# Patient Record
Sex: Female | Born: 1935 | Race: White | Hispanic: No | Smoking: Never smoker
Health system: Southern US, Community
[De-identification: ages and names within clinical notes are randomized; demographics above are authoritative.]

## PROBLEM LIST (undated history)

## (undated) DIAGNOSIS — K219 Gastro-esophageal reflux disease without esophagitis: Secondary | ICD-10-CM

## (undated) DIAGNOSIS — F32A Depression, unspecified: Secondary | ICD-10-CM

## (undated) DIAGNOSIS — E079 Disorder of thyroid, unspecified: Secondary | ICD-10-CM

## (undated) DIAGNOSIS — M1990 Unspecified osteoarthritis, unspecified site: Secondary | ICD-10-CM

## (undated) DIAGNOSIS — F329 Major depressive disorder, single episode, unspecified: Secondary | ICD-10-CM

## (undated) DIAGNOSIS — D049 Carcinoma in situ of skin, unspecified: Secondary | ICD-10-CM

## (undated) DIAGNOSIS — I10 Essential (primary) hypertension: Secondary | ICD-10-CM

## (undated) DIAGNOSIS — E785 Hyperlipidemia, unspecified: Secondary | ICD-10-CM

## (undated) DIAGNOSIS — K5790 Diverticulosis of intestine, part unspecified, without perforation or abscess without bleeding: Secondary | ICD-10-CM

## (undated) HISTORY — PX: HEMORRHOID SURGERY: SHX153

## (undated) HISTORY — PX: DILATION AND CURETTAGE OF UTERUS: SHX78

---

## 2004-04-10 ENCOUNTER — Other Ambulatory Visit: Payer: Self-pay

## 2005-10-31 ENCOUNTER — Ambulatory Visit: Payer: Self-pay | Admitting: Gastroenterology

## 2006-01-12 ENCOUNTER — Inpatient Hospital Stay: Payer: Self-pay | Admitting: Vascular Surgery

## 2006-03-20 ENCOUNTER — Ambulatory Visit: Payer: Self-pay | Admitting: Gastroenterology

## 2011-01-18 ENCOUNTER — Ambulatory Visit: Payer: Self-pay | Admitting: Unknown Physician Specialty

## 2011-01-18 HISTORY — PX: COLONOSCOPY: SHX174

## 2011-01-22 LAB — PATHOLOGY REPORT

## 2011-02-22 ENCOUNTER — Ambulatory Visit: Payer: Self-pay | Admitting: Internal Medicine

## 2011-04-01 ENCOUNTER — Ambulatory Visit: Payer: Self-pay | Admitting: Internal Medicine

## 2014-03-01 ENCOUNTER — Ambulatory Visit: Payer: Self-pay | Admitting: Family Medicine

## 2014-05-18 ENCOUNTER — Ambulatory Visit: Payer: Self-pay | Admitting: Unknown Physician Specialty

## 2014-05-18 HISTORY — PX: ESOPHAGOGASTRODUODENOSCOPY: SHX1529

## 2014-05-20 LAB — PATHOLOGY REPORT

## 2015-10-25 DIAGNOSIS — J019 Acute sinusitis, unspecified: Secondary | ICD-10-CM | POA: Diagnosis not present

## 2015-10-25 DIAGNOSIS — B9689 Other specified bacterial agents as the cause of diseases classified elsewhere: Secondary | ICD-10-CM | POA: Diagnosis not present

## 2015-11-21 DIAGNOSIS — Z1231 Encounter for screening mammogram for malignant neoplasm of breast: Secondary | ICD-10-CM | POA: Diagnosis not present

## 2015-11-21 DIAGNOSIS — N6489 Other specified disorders of breast: Secondary | ICD-10-CM | POA: Diagnosis not present

## 2016-01-17 ENCOUNTER — Ambulatory Visit
Admission: RE | Admit: 2016-01-17 | Discharge: 2016-01-17 | Disposition: A | Discharge status: Home / Self Care | Payer: Medicare Other | Source: Ambulatory Visit | Attending: Family Medicine | Admitting: Family Medicine

## 2016-01-17 ENCOUNTER — Other Ambulatory Visit: Payer: Self-pay | Admitting: Family Medicine

## 2016-01-17 DIAGNOSIS — J309 Allergic rhinitis, unspecified: Secondary | ICD-10-CM | POA: Diagnosis not present

## 2016-01-17 DIAGNOSIS — R053 Chronic cough: Secondary | ICD-10-CM

## 2016-01-17 DIAGNOSIS — R05 Cough: Secondary | ICD-10-CM

## 2016-04-16 ENCOUNTER — Other Ambulatory Visit: Payer: Self-pay | Admitting: Nurse Practitioner

## 2016-04-16 DIAGNOSIS — K219 Gastro-esophageal reflux disease without esophagitis: Secondary | ICD-10-CM

## 2016-04-16 DIAGNOSIS — R1310 Dysphagia, unspecified: Secondary | ICD-10-CM

## 2016-04-19 ENCOUNTER — Other Ambulatory Visit: Payer: Medicare Other

## 2016-04-22 ENCOUNTER — Ambulatory Visit
Admission: RE | Admit: 2016-04-22 | Discharge: 2016-04-22 | Disposition: A | Discharge status: Home / Self Care | Payer: Medicare Other | Source: Ambulatory Visit | Attending: Nurse Practitioner | Admitting: Nurse Practitioner

## 2016-04-22 DIAGNOSIS — R1310 Dysphagia, unspecified: Secondary | ICD-10-CM | POA: Diagnosis not present

## 2016-04-22 DIAGNOSIS — K449 Diaphragmatic hernia without obstruction or gangrene: Secondary | ICD-10-CM | POA: Diagnosis not present

## 2016-04-22 DIAGNOSIS — K228 Other specified diseases of esophagus: Secondary | ICD-10-CM | POA: Insufficient documentation

## 2016-04-22 DIAGNOSIS — K219 Gastro-esophageal reflux disease without esophagitis: Secondary | ICD-10-CM | POA: Diagnosis not present

## 2016-07-04 DIAGNOSIS — Z23 Encounter for immunization: Secondary | ICD-10-CM | POA: Diagnosis not present

## 2016-10-16 DIAGNOSIS — E039 Hypothyroidism, unspecified: Secondary | ICD-10-CM | POA: Diagnosis not present

## 2016-10-16 DIAGNOSIS — F329 Major depressive disorder, single episode, unspecified: Secondary | ICD-10-CM | POA: Diagnosis not present

## 2016-10-16 DIAGNOSIS — I10 Essential (primary) hypertension: Secondary | ICD-10-CM | POA: Diagnosis not present

## 2016-10-16 DIAGNOSIS — K219 Gastro-esophageal reflux disease without esophagitis: Secondary | ICD-10-CM | POA: Diagnosis not present

## 2016-10-16 DIAGNOSIS — Z0000 Encounter for general adult medical examination without abnormal findings: Secondary | ICD-10-CM | POA: Diagnosis not present

## 2016-10-16 DIAGNOSIS — E079 Disorder of thyroid, unspecified: Secondary | ICD-10-CM | POA: Diagnosis not present

## 2016-10-16 DIAGNOSIS — Z23 Encounter for immunization: Secondary | ICD-10-CM | POA: Diagnosis not present

## 2016-10-16 DIAGNOSIS — E784 Other hyperlipidemia: Secondary | ICD-10-CM | POA: Diagnosis not present

## 2016-11-27 DIAGNOSIS — Z1231 Encounter for screening mammogram for malignant neoplasm of breast: Secondary | ICD-10-CM | POA: Diagnosis not present

## 2016-11-27 DIAGNOSIS — R928 Other abnormal and inconclusive findings on diagnostic imaging of breast: Secondary | ICD-10-CM | POA: Diagnosis not present

## 2016-11-27 DIAGNOSIS — R921 Mammographic calcification found on diagnostic imaging of breast: Secondary | ICD-10-CM | POA: Diagnosis not present

## 2017-01-28 IMAGING — RF DG ESOPHAGUS
1 series · 11 of 11 positions shown · non-contrast
Comparison: None in PACs

CLINICAL DATA: Difficulty swallowing for the past 3-4 months,
occasional vomiting due to this problem.

EXAM:
ESOPHOGRAM / BARIUM SWALLOW / BARIUM TABLET STUDY
TECHNIQUE: Combined double contrast and single contrast examination performed
using effervescent crystals, thick barium liquid, and thin barium
liquid. The patient was observed with fluoroscopy swallowing a 13 mm
barium sulphate tablet.
FLUOROSCOPY TIME:  Fluoroscopy Time:  1 minutes, 12 seconds
Number of Acquired Images:  11

[Series 1: fluoro_barium 2fps_bw · 0.17mm/px · 11 of 11 slices shown]
[im 1/11]
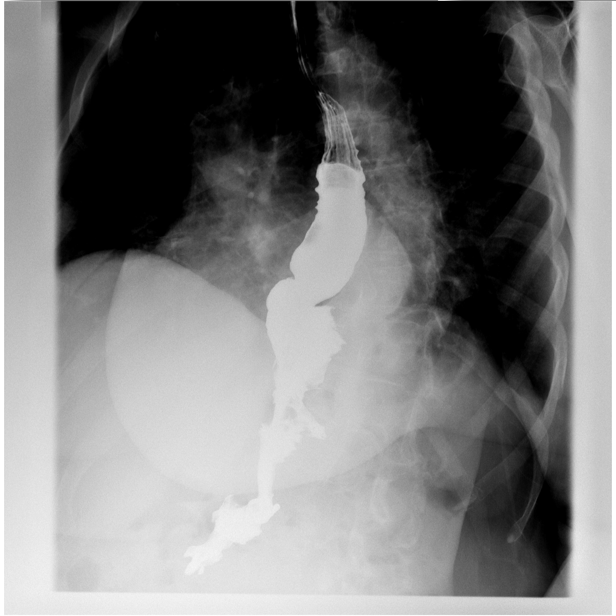
[im 2/11]
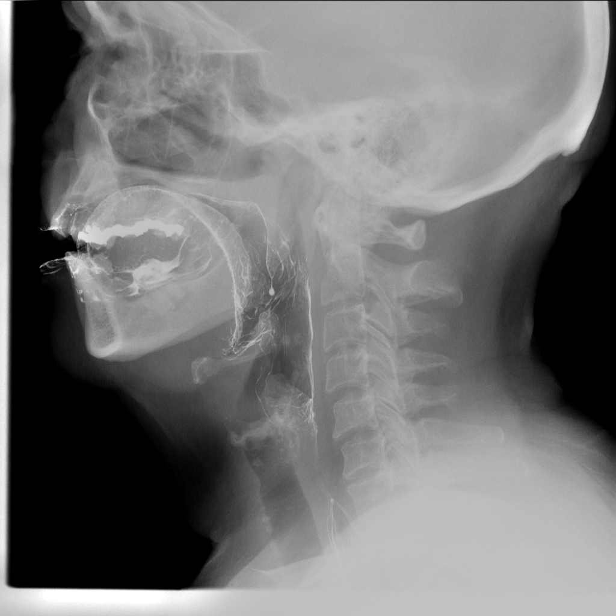
[im 3/11]
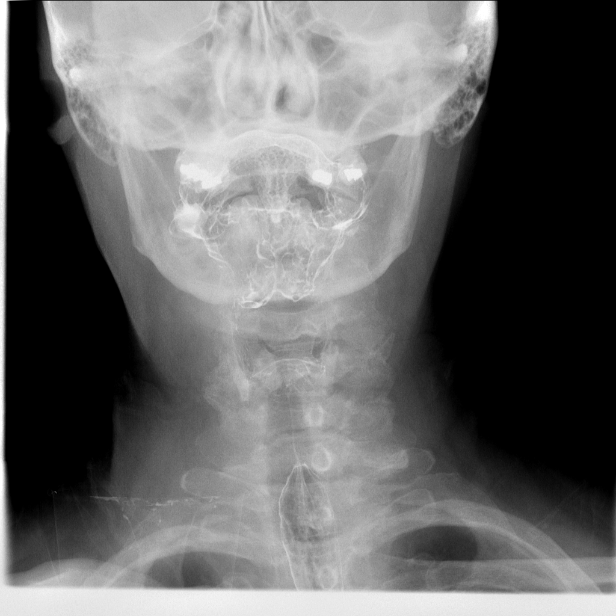
[im 4/11]
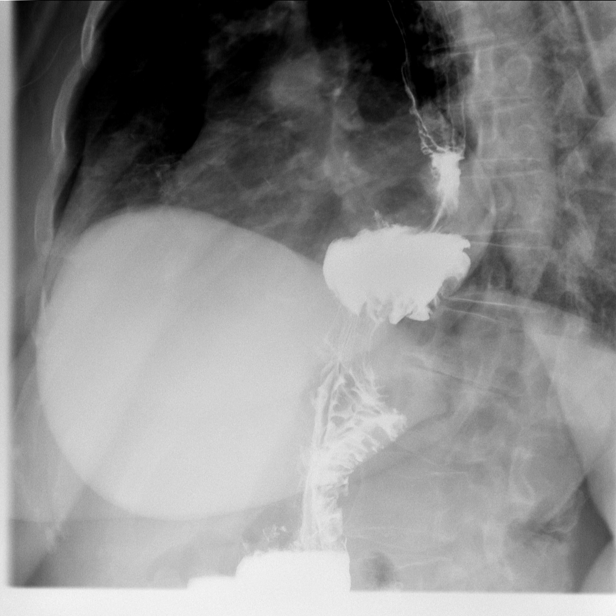
[im 5/11]
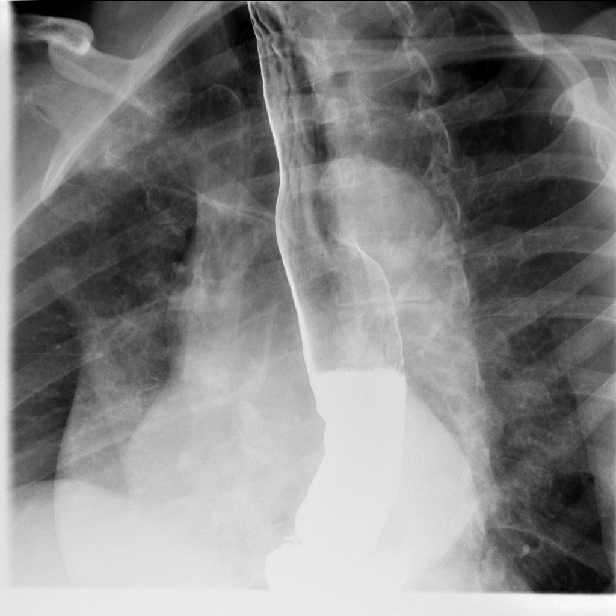
[im 6/11]
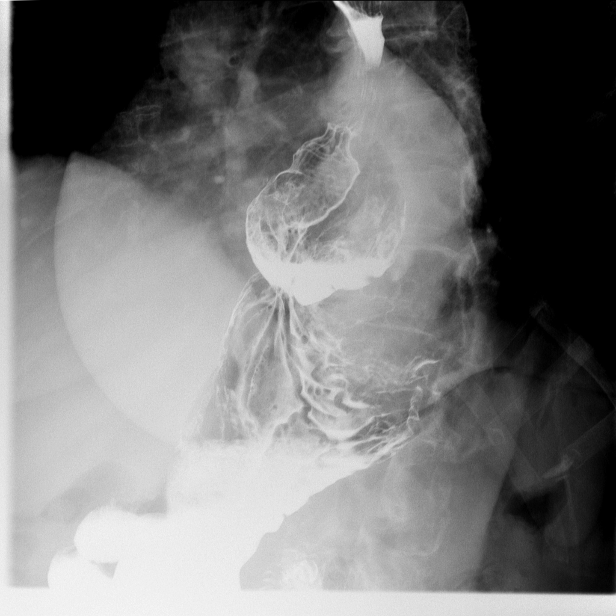
[im 7/11]
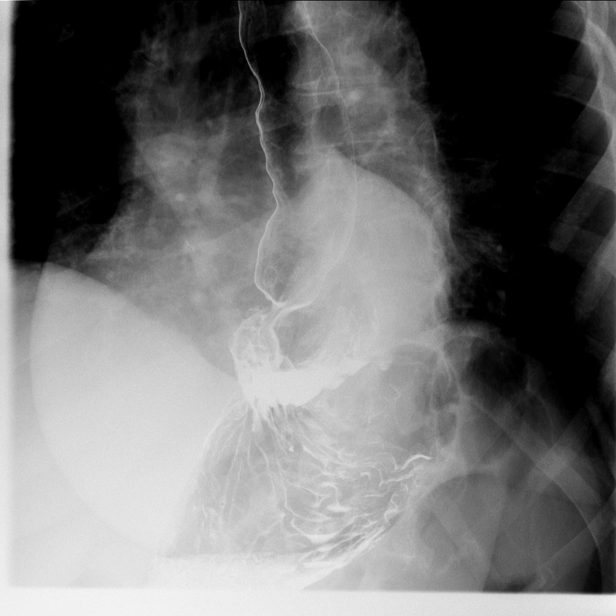
[im 8/11]
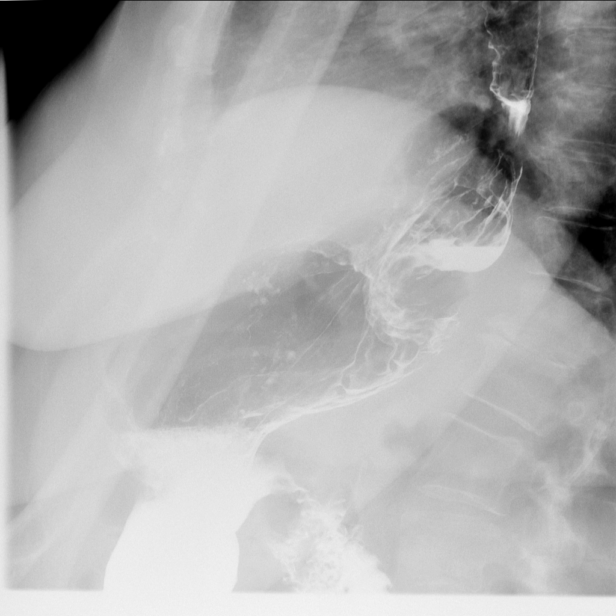
[im 9/11]
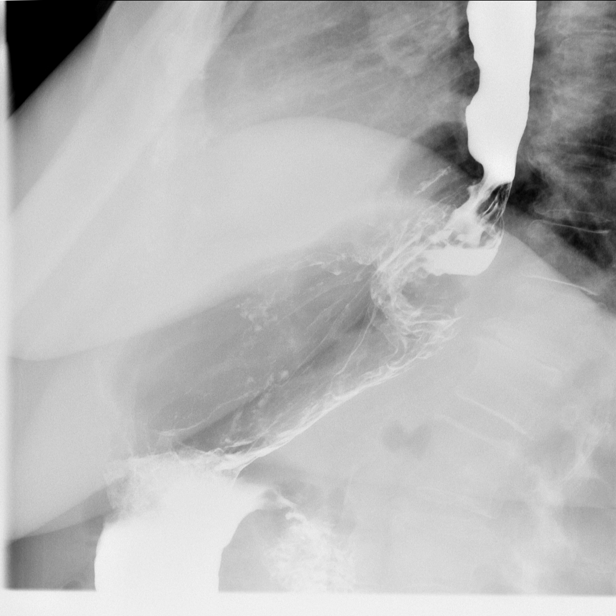
[im 10/11]
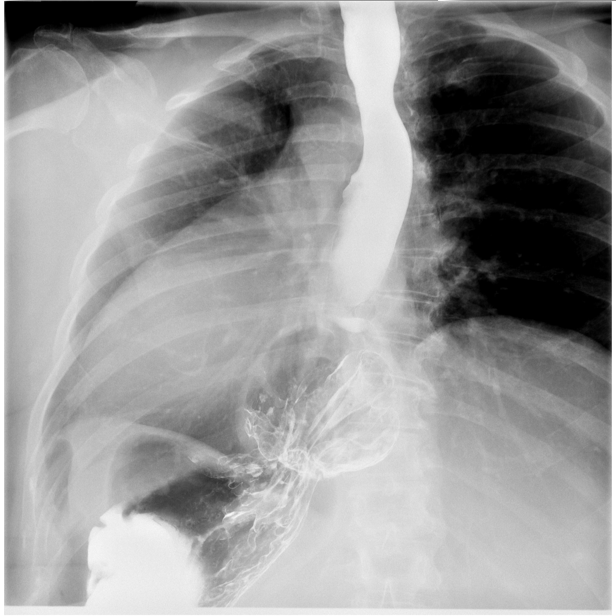
[im 11/11]
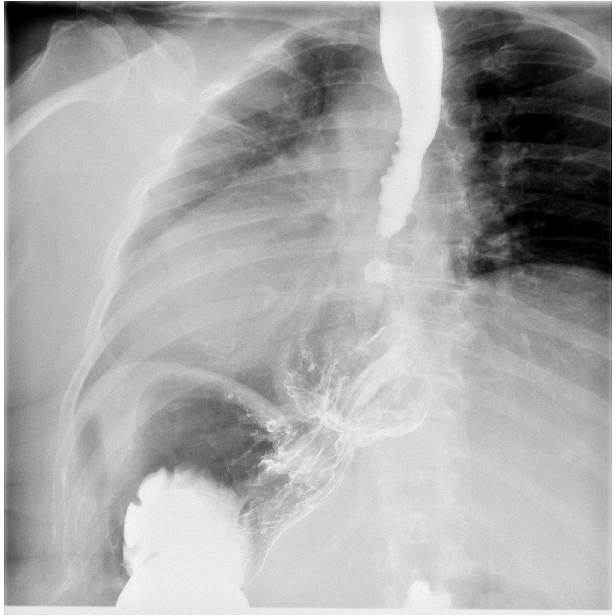

[11 of 11 positions shown; findings below may reference images not displayed]

FINDINGS: The patient ingested the thick and thin barium and gas forming
crystals without difficulty. The cervical esophagus distended well.
There was no laryngeal penetration of the barium. The thoracic
esophagus distended well down to its distal portion where there were
prominent tertiary contractions. A large non reducible hiatal hernia
was observed. There was no evidence of ulceration, stricture, or
mass. The barium tablet passed promptly from the mouth into the
stomach. The channel through the aortic hiatus is broad in the was
no difficulty emptying the hiatal hernia into the body of the
stomach. A small amount of gastroesophageal reflux was observed in
the prone position.
IMPRESSION: Moderate-sized hiatal hernia, incompletely reducible. There is no
evidence of stricture nor esophagitis nor significant reflux. When
distended with gas, the hiatal hernia produces mild mass effect upon
the distal esophagus just above the junction with the hiatal hernia.

Mild changes of presbyesophagus.

## 2017-02-11 ENCOUNTER — Ambulatory Visit
Admission: EM | Admit: 2017-02-11 | Discharge: 2017-02-11 | Disposition: A | Discharge status: Home / Self Care | Payer: Medicare Other | Attending: Family Medicine | Admitting: Family Medicine

## 2017-02-11 DIAGNOSIS — W57XXXA Bitten or stung by nonvenomous insect and other nonvenomous arthropods, initial encounter: Secondary | ICD-10-CM | POA: Insufficient documentation

## 2017-02-11 DIAGNOSIS — S30861A Insect bite (nonvenomous) of abdominal wall, initial encounter: Secondary | ICD-10-CM | POA: Insufficient documentation

## 2017-02-11 HISTORY — DX: Essential (primary) hypertension: I10

## 2017-02-11 HISTORY — DX: Diverticulosis of intestine, part unspecified, without perforation or abscess without bleeding: K57.90

## 2017-02-11 HISTORY — DX: Unspecified osteoarthritis, unspecified site: M19.90

## 2017-02-11 HISTORY — DX: Gastro-esophageal reflux disease without esophagitis: K21.9

## 2017-02-11 HISTORY — DX: Major depressive disorder, single episode, unspecified: F32.9

## 2017-02-11 HISTORY — DX: Carcinoma in situ of skin, unspecified: D04.9

## 2017-02-11 HISTORY — DX: Hyperlipidemia, unspecified: E78.5

## 2017-02-11 HISTORY — DX: Depression, unspecified: F32.A

## 2017-02-11 HISTORY — DX: Disorder of thyroid, unspecified: E07.9

## 2017-02-11 MED ORDER — MUPIROCIN 2 % EX OINT: TOPICAL_OINTMENT | CUTANEOUS | 0 refills | Status: DC

## 2017-02-11 MED ORDER — DOXYCYCLINE HYCLATE 100 MG PO TABS
200.0000 mg | ORAL_TABLET | Freq: Once | ORAL | Status: AC
  Administered 2017-02-11: 200 mg via ORAL

## 2017-02-11 NOTE — ED Provider Notes (Signed)
[facility] ____________________________________________  Time seen: Approximately 5:12 PM  I have reviewed the triage vital signs and the nursing notes.   HISTORY  Chief Complaint Tick Removal   HPI Wanda Phillips is a 82 y.o. female presenting for evaluation of a tick bite site. Patient states she pulled a tick off her left side 40 minutes prior to arrival. Patient reports that this was a deer tick and was on for approximately 2 days. Patient reports that she is feeling well, but was worried about the tick bite site. States that the site does itch some but denies any rash. Denies any other skin changes. Denies any other insect bites or tick bites or tick attachments. Patient expressed concern of Lyme disease that she states that she has had a bull's-eye rash many years ago from a tick bite as well as her daughter has had Lyme disease. Patient reports that the tick was removed just prior to arrival and appeared to have the head intact. Denies any pain. Reports feels well. Denies fevers, headaches, joint pain, malaise, chest pain, shortness of breath. States that she did have some diarrhea yesterday that was short lived and fully resolved. Denies any other complaints or recent changes.  Denies recent sickness. Denies recent antibiotic use. Denies cardiac history. Denies renal insufficiency.Reports tetanus immunization up to date.   WHITE, Arlyss Repress, NP: PCP   Past Medical History:  Diagnosis Date  . Arthritis   . Basal cell carcinoma in situ of skin   . Depression   . Diverticulosis   . GERD (gastroesophageal reflux disease)   . Hyperlipidemia   . Hypertension   . Thyroid disease     There are no active problems to display for this patient.   Past Surgical History:  Procedure Laterality Date  . COLONOSCOPY  01/18/2011   Diverticulosis  . DILATION AND CURETTAGE OF UTERUS    . ESOPHAGOGASTRODUODENOSCOPY  05/18/2014   No Repeat  . HEMORRHOID SURGERY        No current facility-administered medications for this encounter.   Current Outpatient Prescriptions:  .  aspirin EC 81 MG tablet, Take 81 mg by mouth daily., Disp: , Rfl:  .  levothyroxine (SYNTHROID, LEVOTHROID) 50 MCG tablet, Take 50 mcg by mouth daily before breakfast., Disp: , Rfl:  .  losartan-hydrochlorothiazide (HYZAAR) 100-25 MG tablet, Take 1 tablet by mouth daily., Disp: , Rfl:  .  Multiple Vitamin (MULTIVITAMIN) tablet, Take 1 tablet by mouth daily., Disp: , Rfl:  .  omeprazole (PRILOSEC) 20 MG capsule, Take 20 mg by mouth daily., Disp: , Rfl:  .  sertraline (ZOLOFT) 50 MG tablet, Take 50 mg by mouth daily., Disp: , Rfl:  .  mupirocin ointment (BACTROBAN) 2 %, Apply three times a day for 7 days., Disp: 22 g, Rfl: 0  Allergies Sulfa antibiotics  Family History  Problem Relation Age of Onset  . Diabetes Mother   . Heart disease Father   . CAD Sister   . CVA Sister   . Arthritis Brother   . Heart attack Brother   . Prostate cancer Brother   . CAD Brother     Social History Social History  Substance Use Topics  . Smoking status: Never Smoker  . Smokeless tobacco: Never Used  . Alcohol use No    Review of Systems Constitutional: No fever/chills ENT: No sore throat. Cardiovascular: Denies chest pain. Respiratory: Denies shortness of breath. Gastrointestinal: No abdominal pain.  No nausea, no vomiting.  No diarrhea.  No constipation. Genitourinary: Negative for dysuria. Musculoskeletal: Negative for back pain. Skin: As above. Neurological: Negative for headaches, focal weakness or numbness.   ____________________________________________   PHYSICAL EXAM:  VITAL SIGNS: ED Triage Vitals  Enc Vitals Group     BP 02/11/17 1624 (!) 142/58     Pulse Rate 02/11/17 1624 79     Resp 02/11/17 1624 18     Temp 02/11/17 1624 98.5 F (36.9 C)     Temp Source 02/11/17 1624 Oral     SpO2 02/11/17 1624 98 %     Weight 02/11/17 1620 170 lb (77.1 kg)     Height  02/11/17 1620  (1.575 m)     Head Circumference --      Peak Flow --      Pain Score --      Pain Loc --      Pain Edu? --      Excl. in GC? --     Constitutional: Alert and oriented. Well appearing and in no acute distress. ENT      Head: Normocephalic and atraumatic. Cardiovascular: Normal rate, regular rhythm. Grossly normal heart sounds.  Good peripheral circulation. Respiratory: Normal respiratory effort without tachypnea nor retractions. Breath sounds are clear and equal bilaterally. No wheezes, rales, rhonchi. Gastrointestinal: Soft and nontender. No distention. No CVA tenderness. Musculoskeletal:  Steady gait. No midline cervical, thoracic or lumbar tenderness to palpation.  Neurologic:  Normal speech and language. Speech is normal. No gait instability.  Skin:  Skin is warm, dry and intact. No rash noted. Except: Left lower flank less than 1 cm area of minimal erythema with centered punctum, no foreign body visible, no surrounding erythema, no fluctuance, no induration, nontender. No other insect foreign bodies noted. Psychiatric: Mood and affect are normal. Speech and behavior are normal. Patient exhibits appropriate insight and judgment  ___________________________________________   LABS (all labs ordered are listed, but only abnormal results are displayed)  Labs Reviewed  ROCKY MTN SPOTTED FVR ABS PNL(IGG+IGM)  B. BURGDORFI ANTIBODIES    PROCEDURES Procedures    INITIAL IMPRESSION / ASSESSMENT AND PLAN / ED COURSE  Pertinent labs & imaging results that were available during my care of the patient were reviewed by me and considered in my medical decision making (see chart for details).  Well appearing patient. No acute distress. Presenting for evaluation of tick bite site post tick removal. Patient denies any other complaints and reports feels well. Denies complaints or presenting symptoms regarding systemic disease. No appearance of cellulitis. Appears to have  localized reaction from tick bite. Wound inspected and no retained product or foreign body noted. Discussed with patient monitoring closely and keeping clean, avoiding scratching. Patient requests to have [facility] spotted fever and Lyme disease labs drawn, labs drawn. Will administer 200 mg total dose of doxycycline in urgent care for prophylactic dose. Will treat with topical Bactroban. Discussed indication, risks and benefits of medications with patient.  Discussed follow up with Primary care physician this week. Discussed follow up and return parameters including no resolution or any worsening concerns. Patient verbalized understanding and agreed to plan.   ____________________________________________   FINAL CLINICAL IMPRESSION(S) / ED DIAGNOSES  Final diagnoses:  Tick bite of flank, initial encounter     New Prescriptions   MUPIROCIN OINTMENT (BACTROBAN) 2 %    Apply three times a day for 7 days.    Note: This dictation was prepared with Dragon dictation along with smaller phrase technology. Any transcriptional errors  that result from this process are unintentional.         Renford Dills, NP 02/13/17 0910    Renford Dills, NP 02/13/17 [phone]

## 2017-02-11 NOTE — ED Triage Notes (Signed)
Patient complains of tick bite to left flank. Patient states that tick has probably been on since Sunday evening. Patient states that she is very concerned since pulling it off. Patient reports that she pulled it off around 40 minutes ago.

## 2017-02-11 NOTE — Discharge Instructions (Signed)
Use medication as prescribed. Monitor. Keep clean.   Follow up with your primary care physician this week as needed. Return to Urgent care for new or worsening concerns.

## 2017-02-13 LAB — B. BURGDORFI ANTIBODIES

## 2017-02-14 LAB — ROCKY MTN SPOTTED FVR ABS PNL(IGG+IGM)
RMSF IGG: UNDETERMINED
RMSF IGM: 0.19 {index} (ref 0.00–0.89)

## 2017-02-14 LAB — RMSF, IGG, IFA: RMSF, IGG, IFA: 1:64 {titer} — ABNORMAL HIGH

## 2017-02-19 ENCOUNTER — Telehealth: Payer: Self-pay | Admitting: *Deleted

## 2017-02-19 NOTE — Telephone Encounter (Signed)
Patient received letter mailed to her requesting a call back. Informed patient that she did have a positive RMSF test result and the prescriptions given during her visit at [facility] are sufficient for treatment. Patient also informed that the Lyme disease test result was negative. Advised patient to follow up with PCP if symptoms develop. Patient confirmed understanding of information.

## 2017-04-22 DIAGNOSIS — Z79899 Other long term (current) drug therapy: Secondary | ICD-10-CM | POA: Diagnosis not present

## 2017-04-22 DIAGNOSIS — L400 Psoriasis vulgaris: Secondary | ICD-10-CM | POA: Diagnosis not present

## 2017-06-30 DIAGNOSIS — Z23 Encounter for immunization: Secondary | ICD-10-CM | POA: Diagnosis not present

## 2017-08-21 ENCOUNTER — Encounter: Payer: Self-pay | Admitting: Emergency Medicine

## 2017-08-21 ENCOUNTER — Ambulatory Visit
Admission: EM | Admit: 2017-08-21 | Discharge: 2017-08-21 | Disposition: A | Discharge status: Home / Self Care | Payer: Medicare Other | Attending: Family Medicine | Admitting: Family Medicine

## 2017-08-21 DIAGNOSIS — J069 Acute upper respiratory infection, unspecified: Secondary | ICD-10-CM

## 2017-08-21 MED ORDER — BENZONATATE 200 MG PO CAPS: ORAL_CAPSULE | ORAL | 0 refills | Status: DC

## 2017-08-21 MED ORDER — HYDROCOD POLST-CPM POLST ER 10-8 MG/5ML PO SUER: 2.5000 mL | Freq: Two times a day (BID) | ORAL | 0 refills | Status: DC

## 2017-08-21 NOTE — ED Provider Notes (Signed)
[facility]    CSN: [id number] Arrival date & time: 08/21/17  1020     History   Chief Complaint Chief Complaint  Patient presents with  . Cough  . Nasal Congestion    HPI Wanda Phillips is a 81 y.o. female.   HPI  This a 81 year old female who presents with a nonproductive cough sore throat and sinus congestion started approximately 5 days ago. She states on Tuesday she felt better but on Wednesday returned touch worse. Works at [employer] and was out of works Saturday Sunday and Monday and has not worked since Wednesday now. She has had no fever but has had some chills. She been taking NyQuil and Robitussin but without relief.         Past Medical History:  Diagnosis Date  . Arthritis   . Basal cell carcinoma in situ of skin   . Depression   . Diverticulosis   . GERD (gastroesophageal reflux disease)   . Hyperlipidemia   . Hypertension   . Thyroid disease     There are no active problems to display for this patient.   Past Surgical History:  Procedure Laterality Date  . COLONOSCOPY  01/18/2011   Diverticulosis  . DILATION AND CURETTAGE OF UTERUS    . ESOPHAGOGASTRODUODENOSCOPY  05/18/2014   No Repeat  . HEMORRHOID SURGERY      OB History    No data available       Home Medications    Prior to Admission medications   Medication Sig Start Date End Date Taking? Authorizing Provider  aspirin EC 81 MG tablet Take 81 mg by mouth daily.   Yes [provider]  levothyroxine (SYNTHROID, LEVOTHROID) 50 MCG tablet Take 50 mcg by mouth daily before breakfast.   Yes [provider]  losartan-hydrochlorothiazide (HYZAAR) 100-25 MG tablet Take 1 tablet by mouth daily.   Yes [provider]  Multiple Vitamin (MULTIVITAMIN) tablet Take 1 tablet by mouth daily.   Yes [provider]  mupirocin ointment (BACTROBAN) 2 % Apply three times a day for 7 days. 02/11/17  Yes Renford DillsMiller, Lindsey, NP  omeprazole (PRILOSEC) 20 MG  capsule Take 20 mg by mouth daily.   Yes [provider]  sertraline (ZOLOFT) 50 MG tablet Take 50 mg by mouth daily.   Yes [provider]  benzonatate (TESSALON) 200 MG capsule Take one cap TID PRN cough 08/21/17   Lutricia Feiloemer, Lashonta Pilling P, PA-C  chlorpheniramine-HYDROcodone [facility](TUSSIONEX PENNKINETIC ER) 10-8 MG/5ML SUER Take 2.5 mLs by mouth 2 (two) times daily. As necessary for cough 08/21/17   Lutricia Feiloemer, Lanaya Bennis P, PA-C    Family History Family History  Problem Relation Age of Onset  . Diabetes Mother   . Heart disease Father   . CAD Sister   . CVA Sister   . Arthritis Brother   . Heart attack Brother   . Prostate cancer Brother   . CAD Brother     Social History Social History  Substance Use Topics  . Smoking status: Never Smoker  . Smokeless tobacco: Never Used  . Alcohol use No     Allergies   Sulfa antibiotics   Review of Systems Review of Systems  Constitutional: Positive for activity change and chills. Negative for diaphoresis, fatigue and fever.  HENT: Positive for congestion, postnasal drip, rhinorrhea, sinus pressure and sore throat.   Respiratory: Positive for cough. Negative for shortness of breath, wheezing and stridor.   All other systems reviewed and are negative.  Physical Exam Triage Vital Signs ED Triage Vitals  Enc Vitals Group     BP 08/21/17 1032 139/62     Pulse Rate 08/21/17 1032 85     Resp 08/21/17 1032 16     Temp 08/21/17 1032 98.9 F (37.2 C)     Temp Source 08/21/17 1032 Oral     SpO2 08/21/17 1032 98 %     Weight 08/21/17 1031 160 lb (72.6 kg)     Height 08/21/17 1031 5\' 2"  (1.575 m)     Head Circumference --      Peak Flow --      Pain Score 08/21/17 1032 0     Pain Loc --      Pain Edu? --      Excl. in GC? --    No data found.   Updated Vital Signs BP 139/62 (BP Location: Left Arm)   Pulse 85   Temp 98.9 F (37.2 C) (Oral)   Resp 16   Ht 5\' 2"  (1.575 m)   Wt 160 lb (72.6 kg)   SpO2 98%   BMI 29.26 kg/m    Visual Acuity Right Eye Distance:   Left Eye Distance:   Bilateral Distance:    Right Eye Near:   Left Eye Near:    Bilateral Near:     Physical Exam  Constitutional: She is oriented to person, place, and time. She appears well-developed and well-nourished. No distress.  HENT:  Head: Normocephalic.  Right Ear: External ear normal.  Left Ear: External ear normal.  Nose: Nose normal.  Mouth/Throat: Oropharynx is clear and moist. No oropharyngeal exudate.  Eyes: Pupils are equal, round, and reactive to light. Right eye exhibits no discharge. Left eye exhibits no discharge.  Neck: Normal range of motion.  Pulmonary/Chest: Effort normal and breath sounds normal.  Musculoskeletal: Normal range of motion.  Lymphadenopathy:    She has no cervical adenopathy.  Neurological: She is alert and oriented to person, place, and time.  Skin: Skin is warm and dry. She is not diaphoretic.  Psychiatric: She has a normal mood and affect. Her behavior is normal. Judgment and thought content normal.  Nursing note and vitals reviewed.    UC Treatments / Results  Labs (all labs ordered are listed, but only abnormal results are displayed) Labs Reviewed - No data to display  EKG  EKG Interpretation None       Radiology No results found.  Procedures Procedures (including critical care time)  Medications Ordered in UC Medications - No data to display   Initial Impression / Assessment and Plan / UC Course  I have reviewed the triage vital signs and the nursing notes.  Pertinent labs & imaging results that were available during my care of the patient were reviewed by me and considered in my medical decision making (see chart for details).     Plan: 1. Test/x-ray results and diagnosis reviewed with patient 2. rx as per orders; risks, benefits, potential side effects reviewed with patient 3. Recommend supportive treatment with rest and fluids. Use Tussionex at nighttime coughing but  she is cautioned regarding the current concentration are judgment not driving while taking the medication. Will be in very cough for careful when she gets up out of bed avoid stumbling or falling. To use the Tessalon Perles during the daytime. If she is not improving she should follow-up with her primary care physician or she may return to our clinic. 4. F/u prn if symptoms worsen or  don't improve   Final Clinical Impressions(s) / UC Diagnoses   Final diagnoses:  Acute upper respiratory infection    New Prescriptions Discharge Medication List as of 08/21/2017 11:03 AM    START taking these medications   Details  benzonatate (TESSALON) 200 MG capsule Take one cap TID PRN cough, Normal    chlorpheniramine-HYDROcodone (TUSSIONEX PENNKINETIC ER) 10-8 MG/5ML SUER Take 2.5 mLs by mouth 2 (two) times daily. As necessary for cough, Starting Thu 08/21/2017, Print         Controlled Substance Prescriptions [place] Controlled Substance Registry consulted? Not Applicable   Lutricia Feil, PA-C 08/21/17 1113

## 2017-08-21 NOTE — ED Triage Notes (Signed)
Patient in today c/o non-productive cough and nasal congestion x 5 days. Patient thinks she has had a low grade fever, but hasn't taken her temperature. Patient denies chills. Patient has tried Nyquil and Robitussin Cough without relief.

## 2017-08-28 ENCOUNTER — Ambulatory Visit
Admission: EM | Admit: 2017-08-28 | Discharge: 2017-08-28 | Disposition: A | Discharge status: Home / Self Care | Payer: Medicare Other | Attending: Family Medicine | Admitting: Family Medicine

## 2017-08-28 ENCOUNTER — Other Ambulatory Visit: Payer: Self-pay

## 2017-08-28 ENCOUNTER — Encounter: Payer: Self-pay | Admitting: *Deleted

## 2017-08-28 DIAGNOSIS — R05 Cough: Secondary | ICD-10-CM | POA: Diagnosis not present

## 2017-08-28 DIAGNOSIS — B349 Viral infection, unspecified: Secondary | ICD-10-CM | POA: Diagnosis not present

## 2017-08-28 DIAGNOSIS — J988 Other specified respiratory disorders: Secondary | ICD-10-CM | POA: Diagnosis not present

## 2017-08-28 MED ORDER — DOXYCYCLINE HYCLATE 100 MG PO CAPS: 100.0000 mg | ORAL_CAPSULE | Freq: Two times a day (BID) | ORAL | 0 refills | Status: DC

## 2017-08-28 NOTE — ED Triage Notes (Signed)
Patient symptoms of cough and nasal congestion have not resolved since visit on 08/21/17.

## 2017-08-28 NOTE — Discharge Instructions (Signed)
Antibiotic as prescribed.  Take care  Dr. Anjani Feuerborn  

## 2017-08-28 NOTE — ED Provider Notes (Signed)
[facility]    CSN: [id number] Arrival date & time: 08/28/17  1704  History   Chief Complaint Chief Complaint  Patient presents with  . Cough  . Nasal Congestion   HPI  81 year old female presents with the above complaints.  Patient was recently seen on 11/1.  Prior to that she had been sick for 5 days.  She has now been sick for 12 days and has had no improvement.  She has taken her medications as prescribed without significant improvement.  She continues to have severe cough, congestion, rhinorrhea and associated fatigue.  No reported fevers or chills.  No known exacerbating relieving factors.  No other associated symptoms.  No other complaints at this time.  Past Medical History:  Diagnosis Date  . Arthritis   . Basal cell carcinoma in situ of skin   . Depression   . Diverticulosis   . GERD (gastroesophageal reflux disease)   . Hyperlipidemia   . Hypertension   . Thyroid disease    Past Surgical History:  Procedure Laterality Date  . COLONOSCOPY  01/18/2011   Diverticulosis  . DILATION AND CURETTAGE OF UTERUS    . ESOPHAGOGASTRODUODENOSCOPY  05/18/2014   No Repeat  . HEMORRHOID SURGERY     OB History    No data available     Home Medications    Prior to Admission medications   Medication Sig Start Date End Date Taking? Authorizing Provider  aspirin EC 81 MG tablet Take 81 mg by mouth daily.    [provider]  benzonatate (TESSALON) 200 MG capsule Take one cap TID PRN cough 08/21/17   Lutricia Feiloemer, William P, PA-C  chlorpheniramine-HYDROcodone [facility](TUSSIONEX PENNKINETIC ER) 10-8 MG/5ML SUER Take 2.5 mLs by mouth 2 (two) times daily. As necessary for cough 08/21/17   Ovid Curdoemer, William P, PA-C  doxycycline (VIBRAMYCIN) 100 MG capsule Take 1 capsule (100 mg total) 2 (two) times daily by mouth. 08/28/17   Tommie Samsook, Erienne Spelman G, DO  levothyroxine (SYNTHROID, LEVOTHROID) 50 MCG tablet Take 50 mcg by mouth daily before breakfast.    [provider]    losartan-hydrochlorothiazide (HYZAAR) 100-25 MG tablet Take 1 tablet by mouth daily.    [provider]  Multiple Vitamin (MULTIVITAMIN) tablet Take 1 tablet by mouth daily.    [provider]  mupirocin ointment (BACTROBAN) 2 % Apply three times a day for 7 days. 02/11/17   Renford DillsMiller, Lindsey, NP  omeprazole (PRILOSEC) 20 MG capsule Take 20 mg by mouth daily.    [provider]  sertraline (ZOLOFT) 50 MG tablet Take 50 mg by mouth daily.    [provider]   Family History Family History  Problem Relation Age of Onset  . Diabetes Mother   . Heart disease Father   . CAD Sister   . CVA Sister   . Arthritis Brother   . Heart attack Brother   . Prostate cancer Brother   . CAD Brother    Social History Social History   Tobacco Use  . Smoking status: Never Smoker  . Smokeless tobacco: Never Used  Substance Use Topics  . Alcohol use: No  . Drug use: No    Allergies   Sulfa antibiotics   Review of Systems Review of Systems  Constitutional: Positive for fatigue. Negative for chills and fever.  HENT: Positive for congestion and rhinorrhea.   Respiratory: Positive for cough.    Physical Exam Triage Vital Signs ED Triage Vitals  Enc Vitals Group  BP 08/28/17 1713 (!) 151/76     Pulse Rate 08/28/17 1713 80     Resp 08/28/17 1713 16     Temp 08/28/17 1713 97.9 F (36.6 C)     Temp src --      SpO2 08/28/17 1713 98 %     Weight --      Height --      Head Circumference --      Peak Flow --      Pain Score 08/28/17 1714 0     Pain Loc --      Pain Edu? --      Excl. in GC? --    Updated Vital Signs BP (!) 151/76 (BP Location: Left Arm)   Pulse 80   Temp 97.9 F (36.6 C)   Resp 16   SpO2 98%   Physical Exam  Constitutional: She is oriented to person, place, and time. She appears well-developed. No distress.  HENT:  Head: Normocephalic and atraumatic.  Mouth/Throat: Oropharynx is clear and moist.  Normal TM's bilaterally.   Eyes: Conjunctivae are normal. Right eye exhibits no discharge. Left eye exhibits no discharge.  Neck: Neck supple.  Cardiovascular: Normal rate and regular rhythm.  No murmur heard. Pulmonary/Chest: Effort normal and breath sounds normal. She has no wheezes. She has no rales.  Lymphadenopathy:    She has no cervical adenopathy.  Neurological: She is alert and oriented to person, place, and time.  Psychiatric: She has a normal mood and affect.  Vitals reviewed.  UC Treatments / Results  Labs (all labs ordered are listed, but only abnormal results are displayed) Labs Reviewed - No data to display  EKG  EKG Interpretation None       Radiology No results found.  Procedures Procedures (including critical care time)  Medications Ordered in UC Medications - No data to display   Initial Impression / Assessment and Plan / UC Course  I have reviewed the triage vital signs and the nursing notes.  Pertinent labs & imaging results that were available during my care of the patient were reviewed by me and considered in my medical decision making (see chart for details).     81 year old female presents with a respiratory infection. Been persistent for nearly 2 weeks. Treating with Doxy.  Final Clinical Impressions(s) / UC Diagnoses   Final diagnoses:  Respiratory infection    ED Discharge Orders        Ordered    doxycycline (VIBRAMYCIN) 100 MG capsule  2 times daily     08/28/17 1720     Controlled Substance Prescriptions [place] Controlled Substance Registry consulted? Not Applicable   Tommie SamsCook, Sharonlee Nine G, DO 08/28/17 1729

## 2017-09-22 DIAGNOSIS — Z79899 Other long term (current) drug therapy: Secondary | ICD-10-CM | POA: Diagnosis not present

## 2017-09-22 DIAGNOSIS — L400 Psoriasis vulgaris: Secondary | ICD-10-CM | POA: Diagnosis not present

## 2017-10-17 DIAGNOSIS — K219 Gastro-esophageal reflux disease without esophagitis: Secondary | ICD-10-CM | POA: Diagnosis not present

## 2017-10-17 DIAGNOSIS — E039 Hypothyroidism, unspecified: Secondary | ICD-10-CM | POA: Diagnosis not present

## 2017-10-17 DIAGNOSIS — Z1231 Encounter for screening mammogram for malignant neoplasm of breast: Secondary | ICD-10-CM | POA: Diagnosis not present

## 2017-10-17 DIAGNOSIS — E2839 Other primary ovarian failure: Secondary | ICD-10-CM | POA: Diagnosis not present

## 2017-10-17 DIAGNOSIS — E871 Hypo-osmolality and hyponatremia: Secondary | ICD-10-CM | POA: Diagnosis not present

## 2017-10-17 DIAGNOSIS — Z0000 Encounter for general adult medical examination without abnormal findings: Secondary | ICD-10-CM | POA: Diagnosis not present

## 2017-10-17 DIAGNOSIS — I10 Essential (primary) hypertension: Secondary | ICD-10-CM | POA: Diagnosis not present

## 2017-10-17 DIAGNOSIS — F329 Major depressive disorder, single episode, unspecified: Secondary | ICD-10-CM | POA: Diagnosis not present

## 2017-10-29 DIAGNOSIS — E871 Hypo-osmolality and hyponatremia: Secondary | ICD-10-CM | POA: Diagnosis not present

## 2017-11-17 DIAGNOSIS — I10 Essential (primary) hypertension: Secondary | ICD-10-CM | POA: Diagnosis not present

## 2017-11-17 DIAGNOSIS — E079 Disorder of thyroid, unspecified: Secondary | ICD-10-CM | POA: Diagnosis not present

## 2017-12-01 DIAGNOSIS — Z1231 Encounter for screening mammogram for malignant neoplasm of breast: Secondary | ICD-10-CM | POA: Diagnosis not present

## 2017-12-01 DIAGNOSIS — Z780 Asymptomatic menopausal state: Secondary | ICD-10-CM | POA: Diagnosis not present

## 2017-12-01 DIAGNOSIS — E2839 Other primary ovarian failure: Secondary | ICD-10-CM | POA: Diagnosis not present

## 2018-02-12 ENCOUNTER — Encounter: Payer: Self-pay | Admitting: Emergency Medicine

## 2018-02-12 ENCOUNTER — Ambulatory Visit: Payer: Medicare Other

## 2018-02-12 ENCOUNTER — Ambulatory Visit
Admission: EM | Admit: 2018-02-12 | Discharge: 2018-02-12 | Disposition: A | Discharge status: Home / Self Care | Payer: Medicare Other | Attending: Family Medicine | Admitting: Family Medicine

## 2018-02-12 DIAGNOSIS — M25511 Pain in right shoulder: Secondary | ICD-10-CM | POA: Diagnosis not present

## 2018-02-12 DIAGNOSIS — I10 Essential (primary) hypertension: Secondary | ICD-10-CM | POA: Insufficient documentation

## 2018-02-12 DIAGNOSIS — S4991XA Unspecified injury of right shoulder and upper arm, initial encounter: Secondary | ICD-10-CM | POA: Diagnosis not present

## 2018-02-12 DIAGNOSIS — K219 Gastro-esophageal reflux disease without esophagitis: Secondary | ICD-10-CM | POA: Diagnosis not present

## 2018-02-12 DIAGNOSIS — E079 Disorder of thyroid, unspecified: Secondary | ICD-10-CM | POA: Diagnosis not present

## 2018-02-12 DIAGNOSIS — F329 Major depressive disorder, single episode, unspecified: Secondary | ICD-10-CM | POA: Insufficient documentation

## 2018-02-12 DIAGNOSIS — Z882 Allergy status to sulfonamides status: Secondary | ICD-10-CM | POA: Insufficient documentation

## 2018-02-12 DIAGNOSIS — E785 Hyperlipidemia, unspecified: Secondary | ICD-10-CM | POA: Insufficient documentation

## 2018-02-12 MED ORDER — MELOXICAM 7.5 MG PO TABS: 7.5000 mg | ORAL_TABLET | Freq: Every day | ORAL | 0 refills | Status: DC

## 2018-02-12 NOTE — ED Provider Notes (Signed)
[facility] ____________________________________________  Time seen: Approximately 11:29 AM  I have reviewed the triage vital signs and the nursing notes.   HISTORY  Chief Complaint Shoulder Pain  HPI Wanda Phillips is a 82 y.o. female presented for evaluation of right shoulder pain that is been present for approximately 3 weeks.  Patient reports gradual onset.  Reports over the last 1 week has noticed the pain to be slightly worse.  Denies any pain radiation, decreased range of motion, fall, direct injury.  Reports that she does still work, and does repetitive movements all day while at work at [employer] and [employer] also reports a few weeks ago she had to help unload a truck and had even more repetitive movements.  No abrupt onset.  Reports she is right-hand dominant.  Has tried some occasional Aleve over-the-counter which does help briefly.  No other over-the-counter medications taken for the same complaints.  Denies history of the same.  Reports she does have a lot of arthritis in most of her joints.  Reports otherwise feels well.  No associated chest pain or shortness of breath. Denies chest pain, shortness of breath, abdominal pain, or rash. Denies recent sickness. Denies recent antibiotic use. Denies renal insufficiency.  Titus MouldWhite, Elizabeth Burney, NP: PCP   Past Medical History:  Diagnosis Date  . Arthritis   . Basal cell carcinoma in situ of skin   . Depression   . Diverticulosis   . GERD (gastroesophageal reflux disease)   . Hyperlipidemia   . Hypertension   . Thyroid disease     There are no active problems to display for this patient.   Past Surgical History:  Procedure Laterality Date  . COLONOSCOPY  01/18/2011   Diverticulosis  . DILATION AND CURETTAGE OF UTERUS    . ESOPHAGOGASTRODUODENOSCOPY  05/18/2014   No Repeat  . HEMORRHOID SURGERY       No current facility-administered medications for this encounter.   Current Outpatient Medications:  .   aspirin EC 81 MG tablet, Take 81 mg by mouth daily., Disp: , Rfl:  .  levothyroxine (SYNTHROID, LEVOTHROID) 50 MCG tablet, Take 50 mcg by mouth daily before breakfast., Disp: , Rfl:  .  losartan-hydrochlorothiazide (HYZAAR) 100-25 MG tablet, Take 1 tablet by mouth daily., Disp: , Rfl:  .  Multiple Vitamin (MULTIVITAMIN) tablet, Take 1 tablet by mouth daily., Disp: , Rfl:  .  sertraline (ZOLOFT) 50 MG tablet, Take 50 mg by mouth daily., Disp: , Rfl:  .  meloxicam (MOBIC) 7.5 MG tablet, Take 1 tablet (7.5 mg total) by mouth daily., Disp: 10 tablet, Rfl: 0 .  mupirocin ointment (BACTROBAN) 2 %, Apply three times a day for 7 days., Disp: 22 g, Rfl: 0  Allergies Sulfa antibiotics  Family History  Problem Relation Age of Onset  . Diabetes Mother   . Heart disease Father   . CAD Sister   . CVA Sister   . Arthritis Brother   . Heart attack Brother   . Prostate cancer Brother   . CAD Brother     Social History Social History   Tobacco Use  . Smoking status: Never Smoker  . Smokeless tobacco: Never Used  Substance Use Topics  . Alcohol use: No  . Drug use: No    Review of Systems Constitutional: No fever/chills Cardiovascular: Denies chest pain. Respiratory: Denies shortness of breath. Gastrointestinal: No abdominal pain.   Genitourinary: Negative for dysuria. Musculoskeletal: Negative for back pain. As above.  Skin: Negative for rash.  ____________________________________________   PHYSICAL EXAM:  VITAL SIGNS: ED Triage Vitals  Enc Vitals Group     BP 02/12/18 1043 129/65     Pulse Rate 02/12/18 1043 78     Resp 02/12/18 1043 16     Temp 02/12/18 1043 98.2 F (36.8 C)     Temp src --      SpO2 02/12/18 1043 99 %     Weight 02/12/18 1039 169 lb (76.7 kg)     Height 02/12/18 1039 5\' 2"  (1.575 m)     Head Circumference --      Peak Flow --      Pain Score 02/12/18 1039 8     Pain Loc --      Pain Edu? --      Excl. in GC? --     Constitutional: Alert and  oriented. Well appearing and in no acute distress. ENT      Head: Normocephalic and atraumatic. Cardiovascular: Normal rate, regular rhythm. Grossly normal heart sounds.  Good peripheral circulation. Respiratory: Normal respiratory effort without tachypnea nor retractions. Breath sounds are clear and equal bilaterally. No wheezes, rales, rhonchi. Musculoskeletal:No midline cervical, thoracic or lumbar tenderness to palpation. Bilateral distal radial pulses strong and equal and easily palpated. Bilateral hand grips strong and equal.  Except: right anterior shoulder proximal humerus and glenoid mild tenderness to direct palpation, tenderness noted along dorsal should as well, no rash, no swelling, no ecchymosis, no point bony tenderness, patient able to fully abduct greater than 90 degrees, pain with resisted abduction, no pain with resisted abduction, negative empty can, no paresthesias noted to upper extremities, right upper extremity otherwise nontender. Neurologic:  Normal speech and language. No gross focal neurologic deficits are appreciated. Speech is normal. No gait instability.  Skin:  Skin is warm, dry and intact. No rash noted. Psychiatric: Mood and affect are normal. Speech and behavior are normal. Patient exhibits appropriate insight and judgment   ___________________________________________   LABS (all labs ordered are listed, but only abnormal results are displayed)  Labs Reviewed - No data to display  RADIOLOGY  Dg Shoulder Right  Result Date: 02/12/2018 CLINICAL DATA:  Shoulder pain following heavy lifting, initial encounter EXAM: RIGHT SHOULDER - 2+ VIEW COMPARISON:  None. FINDINGS: Significant degenerative changes of the acromioclavicular and glenohumeral joints are seen. The humeral head is high-riding articulating with the acromion consistent with a chronic rotator cuff injury. IMPRESSION: Chronic changes as described. No acute fracture or dislocation is noted. No soft tissue  changes are noted. Electronically Signed   By: Alcide Clever M.D.   On: 02/12/2018 11:33   ____________________________________________   PROCEDURES Procedures    INITIAL IMPRESSION / ASSESSMENT AND PLAN / ED COURSE  Pertinent labs & imaging results that were available during my care of the patient were reviewed by me and considered in my medical decision making (see chart for details).  Well-appearing patient..  No acute distress.  Denies direct trauma.  Patient with known osteoarthritis and does a lot of repetitive motions as she continues to work.  Suspect tendinitis versus bursitis of right shoulder.right shoulder x-ray results as above per radiologist, chronic changes with high riding humeral head consistent with chronic rotator cuff injury.  Discussed rest, pendulum exercises, oral daily Mobic with breakthrough Tylenol as needed as well as orthopedic follow-up as needed for continued pain. Discussed indication, risks and benefits of medications with patient.  Discussed follow up with Primary care physician this week. Discussed follow up and return  parameters including no resolution or any worsening concerns. Patient verbalized understanding and agreed to plan.   ____________________________________________   FINAL CLINICAL IMPRESSION(S) / ED DIAGNOSES  Final diagnoses:  Acute pain of right shoulder     ED Discharge Orders        Ordered    meloxicam (MOBIC) 7.5 MG tablet  Daily     02/12/18 1138       Note: This dictation was prepared with Dragon dictation along with smaller phrase technology. Any transcriptional errors that result from this process are unintentional.         Renford Dills, NP 02/12/18 1141

## 2018-02-12 NOTE — ED Triage Notes (Signed)
Patient states she developed shoulder pain a few weeks ago and it has continued to get worse

## 2018-02-12 NOTE — Discharge Instructions (Addendum)
Take medication as prescribed. Rest. Drink plenty of fluids. Stretch.   Follow up with orthopedic next week as discussed. Call to schedule.   Follow up with your primary care physician this week as needed. Return to Urgent care for new or worsening concerns.

## 2018-03-24 DIAGNOSIS — L400 Psoriasis vulgaris: Secondary | ICD-10-CM | POA: Diagnosis not present

## 2018-03-24 DIAGNOSIS — Z79899 Other long term (current) drug therapy: Secondary | ICD-10-CM | POA: Diagnosis not present

## 2018-03-24 DIAGNOSIS — L570 Actinic keratosis: Secondary | ICD-10-CM | POA: Diagnosis not present

## 2018-05-21 ENCOUNTER — Other Ambulatory Visit: Payer: Self-pay

## 2018-05-21 ENCOUNTER — Ambulatory Visit
Admission: EM | Admit: 2018-05-21 | Discharge: 2018-05-21 | Disposition: A | Discharge status: Home / Self Care | Payer: Medicare Other | Attending: Emergency Medicine | Admitting: Emergency Medicine

## 2018-05-21 ENCOUNTER — Encounter: Payer: Self-pay | Admitting: Emergency Medicine

## 2018-05-21 DIAGNOSIS — J302 Other seasonal allergic rhinitis: Secondary | ICD-10-CM

## 2018-05-21 MED ORDER — CETIRIZINE HCL 5 MG PO TABS: 5.0000 mg | ORAL_TABLET | Freq: Every day | ORAL | 0 refills | Status: AC

## 2018-05-21 NOTE — ED Triage Notes (Signed)
Patient c/o cough, congestion, runny nose, sore throat and HAs since Tuesday.  Patient denies fevers.

## 2018-05-21 NOTE — ED Provider Notes (Signed)
HPI  SUBJECTIVE:  Wanda Phillips is a 82 y.o. female who presents with 2 days of clear nasal congestion, rhinorrhea, postnasal drip, sneezing, cough, watery eyes, sore throat.  She denies fevers, sinus pain or pressure, wheezing, chest pain, shortness of breath, ear pain.  No sick contacts.  No antipyretics in the past 4 to 6 hours.  She tried Mucinex without improvement in her symptoms.  She tried Flonase with improvement in her symptoms.  She also used Vicks VapoRub.  No aggravating factors.  She has a past medical history of seasonal allergies that bother her around this time of year.  States this feels similar to those.  No history of diabetes, hypertension, kidney disease, asthma.  ATF:TDDUK, Arlyss Repress, NP   Past Medical History:  Diagnosis Date  . Arthritis   . Basal cell carcinoma in situ of skin   . Depression   . Diverticulosis   . GERD (gastroesophageal reflux disease)   . Hyperlipidemia   . Hypertension   . Thyroid disease     Past Surgical History:  Procedure Laterality Date  . COLONOSCOPY  01/18/2011   Diverticulosis  . DILATION AND CURETTAGE OF UTERUS    . ESOPHAGOGASTRODUODENOSCOPY  05/18/2014   No Repeat  . HEMORRHOID SURGERY      Family History  Problem Relation Age of Onset  . Diabetes Mother   . Heart disease Father   . CAD Sister   . CVA Sister   . Arthritis Brother   . Heart attack Brother   . Prostate cancer Brother   . CAD Brother     Social History   Tobacco Use  . Smoking status: Never Smoker  . Smokeless tobacco: Never Used  Substance Use Topics  . Alcohol use: No  . Drug use: No    No current facility-administered medications for this encounter.   Current Outpatient Medications:  .  aspirin EC 81 MG tablet, Take 81 mg by mouth daily., Disp: , Rfl:  .  levothyroxine (SYNTHROID, LEVOTHROID) 50 MCG tablet, Take 50 mcg by mouth daily before breakfast., Disp: , Rfl:  .  losartan-hydrochlorothiazide (HYZAAR) 100-25 MG tablet, Take 1  tablet by mouth daily., Disp: , Rfl:  .  Multiple Vitamin (MULTIVITAMIN) tablet, Take 1 tablet by mouth daily., Disp: , Rfl:  .  sertraline (ZOLOFT) 50 MG tablet, Take 50 mg by mouth daily., Disp: , Rfl:  .  cetirizine (ZYRTEC) 5 MG tablet, Take 1 tablet (5 mg total) by mouth daily., Disp: 30 tablet, Rfl: 0  Allergies  Allergen Reactions  . Sulfa Antibiotics      ROS  As noted in HPI.   Physical Exam  BP 139/62 (BP Location: Left Arm)   Pulse 72   Temp 98.7 F (37.1 C) (Oral)   Resp 14   Ht 5\' 1"  (1.549 m)   Wt 160 lb (72.6 kg)   SpO2 99%   BMI 30.23 kg/m   Constitutional: Well developed, well nourished, no acute distress Eyes:  EOMI, conjunctiva normal bilaterally HENT: Normocephalic, atraumatic,mucus membranes moist. positive clear rhinorrhea.  Erythematous, swollen turbinates.  No sinus tenderness.  Normal oropharynx.  Tonsils normal.  Uvula midline.  No cobblestoning, postnasal drip Neck: No cervical lymphadenopathy Respiratory: Normal inspiratory effort lungs clear bilaterally Cardiovascular: Normal rate no murmurs rubs or gallops GI: nondistended skin: No rash, skin intact Musculoskeletal: no deformities Neurologic: Alert & oriented x 3, no focal neuro deficits Psychiatric: Speech and behavior appropriate   ED Course   Medications -  No data to display  No orders of the defined types were placed in this encounter.   No results found for this or any previous visit (from the past 24 hour(s)). No results found.  ED Clinical Impression  Seasonal allergies   ED Assessment/Plan  Presentation suggestive of allergies.  Will start her on Zyrtec as she states that that has worked well for her in the past.  Location managerContinue Flonase, states that she has plenty at home.  Advised saline nasal irrigation with a Lloyd HugerNeil med sinus rinse.  Work note for today and tomorrow.  Follow-up with Dr. Cliffton AstersWhite or may return here if not better in 10 days, fevers, or other concerns.  Discussed  MDM, treatment plan, and plan for follow-up with patient.  patient agrees with plan.   Meds ordered this encounter  Medications  . cetirizine (ZYRTEC) 5 MG tablet    Sig: Take 1 tablet (5 mg total) by mouth daily.    Dispense:  30 tablet    Refill:  0    *This clinic note was created using Scientist, clinical (histocompatibility and immunogenetics)Dragon dictation software. Therefore, there may be occasional mistakes despite careful proofreading.   ?   Domenick GongMortenson, Sada Mazzoni, MD 05/21/18 1014

## 2018-05-21 NOTE — Discharge Instructions (Addendum)
Use the Flonase to 2 sprays in each nostril once daily.  Start with 5 mg of Zyrtec.  use saline nasal irrigation with a Lloyd HugerNeil med rinse and distilled water.  You May do this as often as you want.  Follow the directions on the box.  This will help prevent a sinus infection and reduce your symptoms significantly.

## 2018-07-03 DIAGNOSIS — Z23 Encounter for immunization: Secondary | ICD-10-CM | POA: Diagnosis not present

## 2018-09-22 DIAGNOSIS — Z79899 Other long term (current) drug therapy: Secondary | ICD-10-CM | POA: Diagnosis not present

## 2018-09-22 DIAGNOSIS — L570 Actinic keratosis: Secondary | ICD-10-CM | POA: Diagnosis not present

## 2018-09-22 DIAGNOSIS — L400 Psoriasis vulgaris: Secondary | ICD-10-CM | POA: Diagnosis not present

## 2018-10-28 DIAGNOSIS — K219 Gastro-esophageal reflux disease without esophagitis: Secondary | ICD-10-CM | POA: Diagnosis not present

## 2018-10-28 DIAGNOSIS — F329 Major depressive disorder, single episode, unspecified: Secondary | ICD-10-CM | POA: Diagnosis not present

## 2018-10-28 DIAGNOSIS — I10 Essential (primary) hypertension: Secondary | ICD-10-CM | POA: Diagnosis not present

## 2018-10-28 DIAGNOSIS — E039 Hypothyroidism, unspecified: Secondary | ICD-10-CM | POA: Diagnosis not present

## 2018-10-28 DIAGNOSIS — E079 Disorder of thyroid, unspecified: Secondary | ICD-10-CM | POA: Diagnosis not present

## 2018-10-28 DIAGNOSIS — E7849 Other hyperlipidemia: Secondary | ICD-10-CM | POA: Diagnosis not present

## 2018-11-20 IMAGING — CR DG SHOULDER 2+V*R*
3 series · 3 of 3 positions shown · non-contrast
Comparison: None.

CLINICAL DATA: Shoulder pain following heavy lifting, initial
encounter

EXAM:
RIGHT SHOULDER - 2+ VIEW

[shoulder grashey]
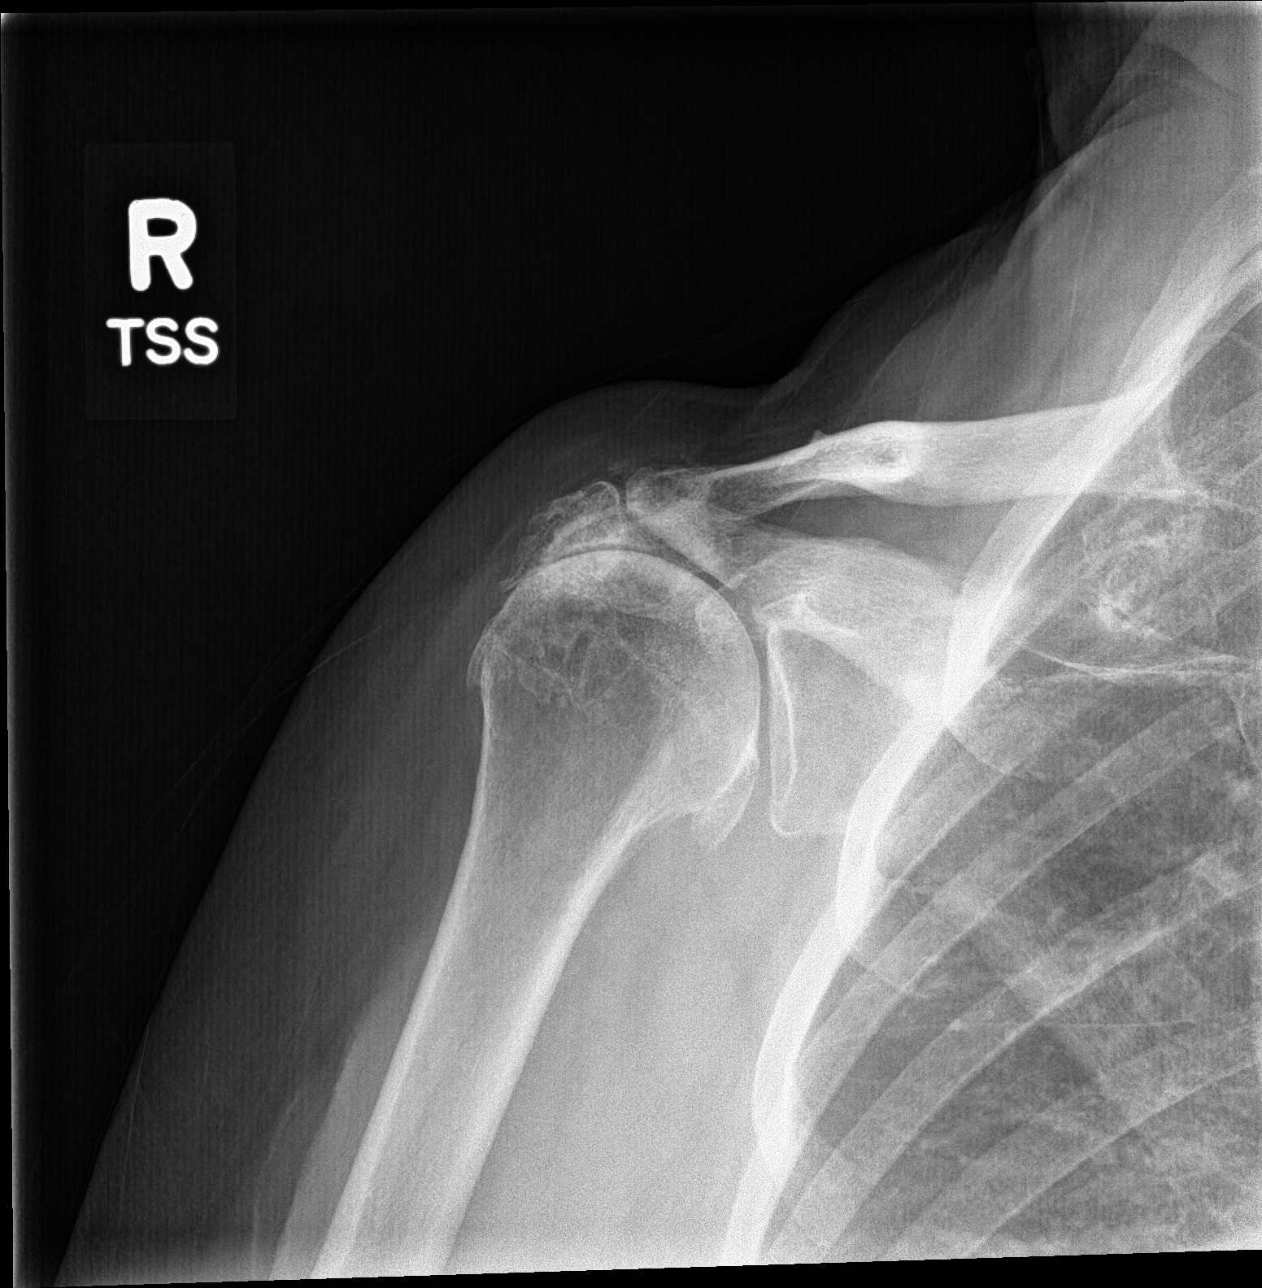

[shoulder y view]
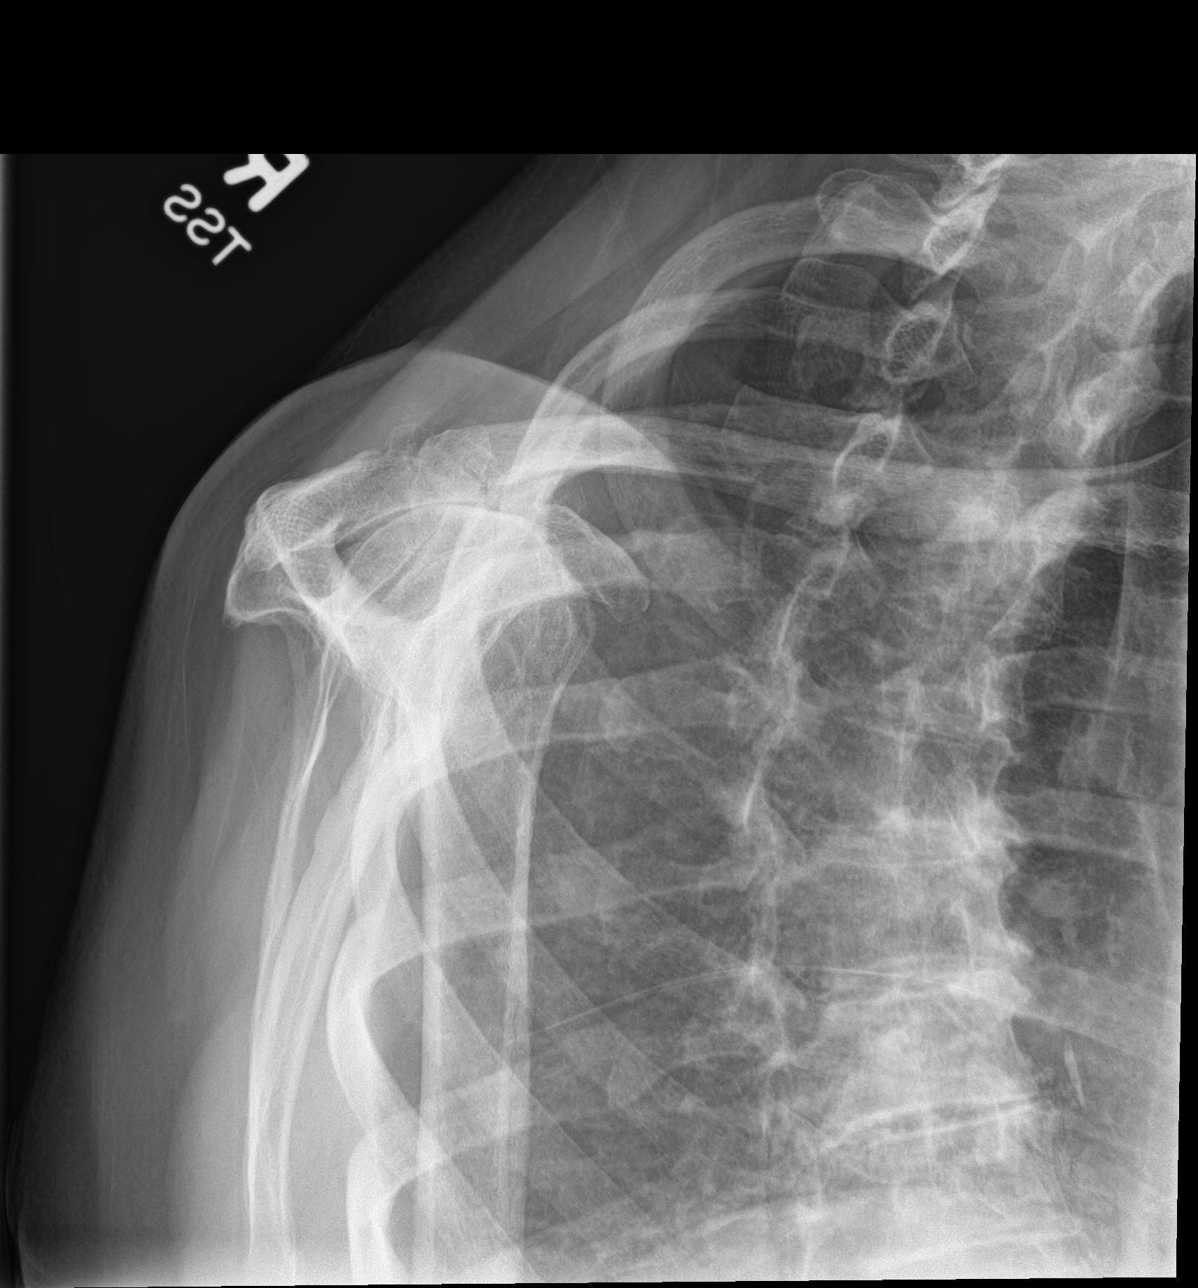

[shoulder axial]
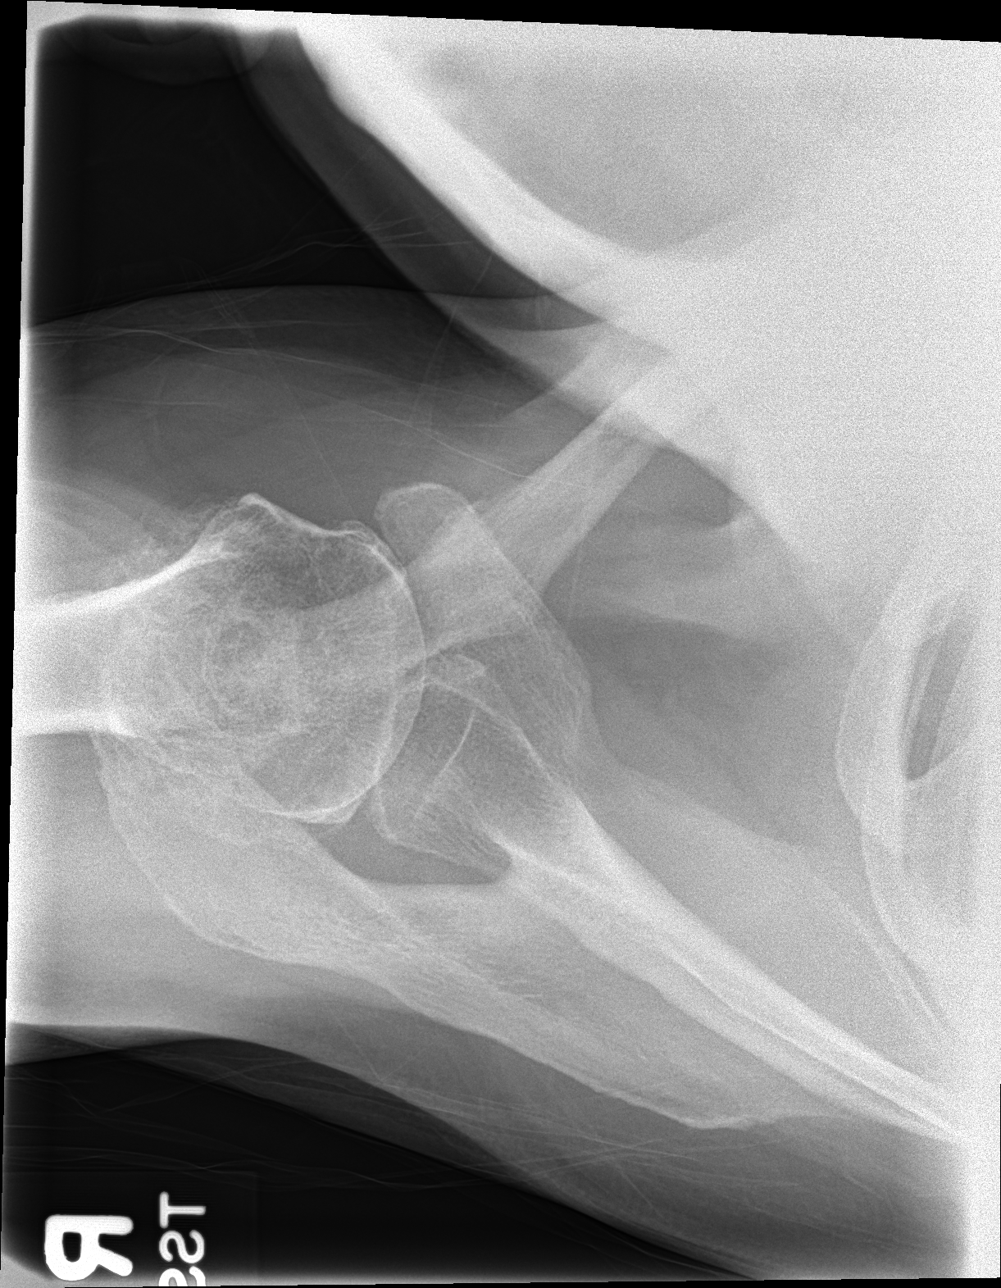

[3 of 3 positions shown; findings below may reference images not displayed]

FINDINGS: Significant degenerative changes of the acromioclavicular and
glenohumeral joints are seen. The humeral head is high-riding
articulating with the acromion consistent with a chronic rotator
cuff injury.
IMPRESSION: Chronic changes as described. No acute fracture or dislocation is
noted. No soft tissue changes are noted.

## 2018-12-07 DIAGNOSIS — Z1231 Encounter for screening mammogram for malignant neoplasm of breast: Secondary | ICD-10-CM | POA: Diagnosis not present

## 2018-12-09 ENCOUNTER — Other Ambulatory Visit: Payer: Self-pay

## 2018-12-09 ENCOUNTER — Ambulatory Visit
Admission: EM | Admit: 2018-12-09 | Discharge: 2018-12-09 | Disposition: A | Discharge status: Home / Self Care | Payer: Medicare Other | Attending: Family Medicine | Admitting: Family Medicine

## 2018-12-09 DIAGNOSIS — R05 Cough: Secondary | ICD-10-CM | POA: Diagnosis not present

## 2018-12-09 DIAGNOSIS — J0100 Acute maxillary sinusitis, unspecified: Secondary | ICD-10-CM

## 2018-12-09 DIAGNOSIS — B9789 Other viral agents as the cause of diseases classified elsewhere: Secondary | ICD-10-CM

## 2018-12-09 DIAGNOSIS — R059 Cough, unspecified: Secondary | ICD-10-CM

## 2018-12-09 MED ORDER — DOXYCYCLINE HYCLATE 100 MG PO TABS: 100.0000 mg | ORAL_TABLET | Freq: Two times a day (BID) | ORAL | 0 refills | Status: DC

## 2018-12-09 MED ORDER — BENZONATATE 200 MG PO CAPS: 200.0000 mg | ORAL_CAPSULE | Freq: Three times a day (TID) | ORAL | 0 refills | Status: DC | PRN

## 2018-12-09 NOTE — Discharge Instructions (Signed)
Flonase nasal spray °

## 2018-12-09 NOTE — ED Provider Notes (Signed)
[facility]    CSN: [id number] Arrival date & time: 12/09/18  1654     History   Chief Complaint Chief Complaint  Patient presents with  . Cough    HPI Wanda Phillips is a 83 y.o. female.   The history is provided by the patient.  Cough  Associated symptoms: no wheezing   URI  Presenting symptoms: congestion, cough and facial pain   Severity:  Moderate Onset quality:  Sudden Duration:  1 week Timing:  Constant Progression:  Worsening Chronicity:  New Relieved by:  Nothing Ineffective treatments:  OTC medications Associated symptoms: sinus pain   Associated symptoms: no wheezing   Risk factors: recent illness and sick contacts     Past Medical History:  Diagnosis Date  . Arthritis   . Basal cell carcinoma in situ of skin   . Depression   . Diverticulosis   . GERD (gastroesophageal reflux disease)   . Hyperlipidemia   . Hypertension   . Thyroid disease     There are no active problems to display for this patient.   Past Surgical History:  Procedure Laterality Date  . COLONOSCOPY  01/18/2011   Diverticulosis  . DILATION AND CURETTAGE OF UTERUS    . ESOPHAGOGASTRODUODENOSCOPY  05/18/2014   No Repeat  . HEMORRHOID SURGERY      OB History   No obstetric history on file.      Home Medications    Prior to Admission medications   Medication Sig Start Date End Date Taking? Authorizing Provider  aspirin EC 81 MG tablet Take 81 mg by mouth daily.   Yes [provider]  cetirizine (ZYRTEC) 5 MG tablet Take 1 tablet (5 mg total) by mouth daily. 05/21/18  Yes Domenick Gong, MD  levothyroxine (SYNTHROID, LEVOTHROID) 50 MCG tablet Take 50 mcg by mouth daily before breakfast.   Yes [provider]  losartan-hydrochlorothiazide (HYZAAR) 100-25 MG tablet Take 1 tablet by mouth daily.   Yes [provider]  Multiple Vitamin (MULTIVITAMIN) tablet Take 1 tablet by mouth daily.   Yes [provider]  sertraline  (ZOLOFT) 50 MG tablet Take 50 mg by mouth daily.   Yes [provider]  benzonatate (TESSALON) 200 MG capsule Take 1 capsule (200 mg total) by mouth 3 (three) times daily as needed. 12/09/18   Payton Mccallum, MD  doxycycline (VIBRA-TABS) 100 MG tablet Take 1 tablet (100 mg total) by mouth 2 (two) times daily. 12/09/18   Payton Mccallum, MD    Family History Family History  Problem Relation Age of Onset  . Diabetes Mother   . Heart disease Father   . CAD Sister   . CVA Sister   . Arthritis Brother   . Heart attack Brother   . Prostate cancer Brother   . CAD Brother     Social History Social History   Tobacco Use  . Smoking status: Never Smoker  . Smokeless tobacco: Never Used  Substance Use Topics  . Alcohol use: No  . Drug use: No     Allergies   Sulfa antibiotics   Review of Systems Review of Systems  HENT: Positive for congestion and sinus pain.   Respiratory: Positive for cough. Negative for wheezing.      Physical Exam Triage Vital Signs ED Triage Vitals  Enc Vitals Group     BP 12/09/18 1717 118/60     Pulse Rate 12/09/18 1717 81     Resp 12/09/18 1717 18  Temp 12/09/18 1717 98.4 F (36.9 C)     Temp Source 12/09/18 1717 Oral     SpO2 12/09/18 1717 98 %     Weight 12/09/18 1715 160 lb (72.6 kg)     Height 12/09/18 1715 5\' 2"  (1.575 m)     Head Circumference --      Peak Flow --      Pain Score 12/09/18 1715 7     Pain Loc --      Pain Edu? --      Excl. in GC? --    No data found.  Updated Vital Signs BP 118/60 (BP Location: Left Arm)   Pulse 81   Temp 98.4 F (36.9 C) (Oral)   Resp 18   Ht 5\' 2"  (1.575 m)   Wt 72.6 kg   SpO2 98%   BMI 29.26 kg/m   Visual Acuity Right Eye Distance:   Left Eye Distance:   Bilateral Distance:    Right Eye Near:   Left Eye Near:    Bilateral Near:     Physical Exam Vitals signs and nursing note reviewed.  Constitutional:      General: She is not in acute distress.    Appearance: She is  well-developed. She is not diaphoretic.  HENT:     Head: Normocephalic and atraumatic.     Right Ear: Tympanic membrane, ear canal and external ear normal.     Left Ear: Tympanic membrane, ear canal and external ear normal.     Nose: Mucosal edema and rhinorrhea present. No nasal deformity, septal deviation or laceration.     Right Sinus: Maxillary sinus tenderness and frontal sinus tenderness present.     Left Sinus: Maxillary sinus tenderness and frontal sinus tenderness present.     Mouth/Throat:     Pharynx: Uvula midline. No oropharyngeal exudate.  Eyes:     General: No scleral icterus.       Right eye: No discharge.        Left eye: No discharge.  Neck:     Musculoskeletal: Normal range of motion and neck supple.     Thyroid: No thyromegaly.  Cardiovascular:     Rate and Rhythm: Normal rate and regular rhythm.     Heart sounds: Normal heart sounds.  Pulmonary:     Effort: Pulmonary effort is normal. No respiratory distress.     Breath sounds: Normal breath sounds. No stridor. No wheezing, rhonchi or rales.  Lymphadenopathy:     Cervical: No cervical adenopathy.      UC Treatments / Results  Labs (all labs ordered are listed, but only abnormal results are displayed) Labs Reviewed - No data to display  EKG None  Radiology No results found.  Procedures Procedures (including critical care time)  Medications Ordered in UC Medications - No data to display  Initial Impression / Assessment and Plan / UC Course  I have reviewed the triage vital signs and the nursing notes.  Pertinent labs & imaging results that were available during my care of the patient were reviewed by me and considered in my medical decision making (see chart for details).      Final Clinical Impressions(s) / UC Diagnoses   Final diagnoses:  Acute maxillary sinusitis, recurrence not specified  Cough    ED Prescriptions    Medication Sig Dispense Auth. Provider   doxycycline (VIBRA-TABS)  100 MG tablet Take 1 tablet (100 mg total) by mouth 2 (two) times daily. 20 tablet Xenia, [place],  MD   benzonatate (TESSALON) 200 MG capsule Take 1 capsule (200 mg total) by mouth 3 (three) times daily as needed. 30 capsule Payton Mccallumonty, Amy Belloso, MD     1. Labs/x-ray results and diagnosis reviewed with patient 2. rx as per orders above; reviewed possible side effects, interactions, risks and benefits  3. Recommend supportive treatment with otc flonase  4. Follow-up prn if symptoms worsen or don't improve   Controlled Substance Prescriptions [place] Controlled Substance Registry consulted? Not Applicable   Payton Mccallumonty, Caitlen Worth, MD 12/09/18 [phone]

## 2018-12-09 NOTE — ED Triage Notes (Signed)
Patient complains of cough, congestion x Friday. Patient states that cough has turned productive.

## 2019-07-14 ENCOUNTER — Other Ambulatory Visit: Payer: Self-pay

## 2019-07-14 ENCOUNTER — Encounter: Payer: Self-pay | Admitting: Emergency Medicine

## 2019-07-14 ENCOUNTER — Ambulatory Visit
Admission: EM | Admit: 2019-07-14 | Discharge: 2019-07-14 | Disposition: A | Discharge status: Home / Self Care | Payer: Medicare HMO | Attending: Internal Medicine | Admitting: Internal Medicine

## 2019-07-14 DIAGNOSIS — S61213A Laceration without foreign body of left middle finger without damage to nail, initial encounter: Secondary | ICD-10-CM | POA: Diagnosis not present

## 2019-07-14 DIAGNOSIS — Z23 Encounter for immunization: Secondary | ICD-10-CM | POA: Diagnosis not present

## 2019-07-14 MED ORDER — TETANUS-DIPHTH-ACELL PERTUSSIS 5-2.5-18.5 LF-MCG/0.5 IM SUSP
0.5000 mL | Freq: Once | INTRAMUSCULAR | Status: AC
  Administered 2019-07-14: 09:00:00 0.5 mL via INTRAMUSCULAR

## 2019-07-14 NOTE — ED Triage Notes (Signed)
Pt here today with cut on third digit finger on her left hand. She states she cut her finger on a food processor last night around 8:30pm.

## 2019-07-14 NOTE — ED Provider Notes (Signed)
[facility]    CSN: [id number] Arrival date & time: 07/14/19  0830      History   Chief Complaint Chief Complaint  Patient presents with  . Extremity Laceration    HPI Wanda Phillips is a 83 y.o. female with a history of hypertension-controlled, hypothyroidism controlled comes to urgent care with complaints of a laceration of the left middle finger.  Laceration occurred yesterday when she was trying to open food processor.  Patient denies any movement finger.  Has pain which is mild to moderate severity.  Pain is constant.   Patient says that the laceration initially bled a lot but currently subsided.  HPI  Past Medical History:  Diagnosis Date  . Arthritis   . Basal cell carcinoma in situ of skin   . Depression   . Diverticulosis   . GERD (gastroesophageal reflux disease)   . Hyperlipidemia   . Hypertension   . Thyroid disease     There are no active problems to display for this patient.   Past Surgical History:  Procedure Laterality Date  . COLONOSCOPY  01/18/2011   Diverticulosis  . DILATION AND CURETTAGE OF UTERUS    . ESOPHAGOGASTRODUODENOSCOPY  05/18/2014   No Repeat  . HEMORRHOID SURGERY      OB History   No obstetric history on file.      Home Medications    Prior to Admission medications   Medication Sig Start Date End Date Taking? Authorizing Provider  aspirin EC 81 MG tablet Take 81 mg by mouth daily.   Yes [provider]  cetirizine (ZYRTEC) 5 MG tablet Take 1 tablet (5 mg total) by mouth daily. 05/21/18  Yes Melynda Ripple, MD  levothyroxine (SYNTHROID, LEVOTHROID) 50 MCG tablet Take 50 mcg by mouth daily before breakfast.   Yes [provider]  losartan-hydrochlorothiazide (HYZAAR) 100-25 MG tablet Take 1 tablet by mouth daily.   Yes [provider]  Multiple Vitamin (MULTIVITAMIN) tablet Take 1 tablet by mouth daily.   Yes [provider]  sertraline (ZOLOFT) 50 MG tablet Take 50 mg by mouth  daily.   Yes [provider]    Family History Family History  Problem Relation Age of Onset  . Diabetes Mother   . Heart disease Father   . CAD Sister   . CVA Sister   . Arthritis Brother   . Heart attack Brother   . Prostate cancer Brother   . CAD Brother     Social History Social History   Tobacco Use  . Smoking status: Never Smoker  . Smokeless tobacco: Never Used  Substance Use Topics  . Alcohol use: No  . Drug use: No     Allergies   Sulfa antibiotics   Review of Systems Review of Systems  Constitutional: Negative.   HENT: Negative.   Respiratory: Negative.   Gastrointestinal: Negative.   Musculoskeletal: Negative.   Skin: Positive for wound.     Physical Exam Triage Vital Signs ED Triage Vitals  Enc Vitals Group     BP      Pulse      Resp      Temp      Temp src      SpO2      Weight      Height      Head Circumference      Peak Flow      Pain Score      Pain Loc  Pain Edu?      Excl. in GC?    No data found.  Updated Vital Signs BP 137/68 (BP Location: Right Arm)   Pulse 67   Temp 98.4 F (36.9 C)   Resp 18   Ht 5\' 2"  (1.575 m)   Wt 74.8 kg   SpO2 98%   BMI 30.18 kg/m   Visual Acuity Right Eye Distance:   Left Eye Distance:   Bilateral Distance:    Right Eye Near:   Left Eye Near:    Bilateral Near:     Physical Exam Constitutional:      General: She is not in acute distress.    Appearance: She is not ill-appearing.  Cardiovascular:     Pulses: Normal pulses.     Heart sounds: Normal heart sounds.  Pulmonary:     Effort: Pulmonary effort is normal.     Breath sounds: Normal breath sounds.  Abdominal:     General: Bowel sounds are normal.     Palpations: Abdomen is soft.  Skin:    Capillary Refill: Capillary refill takes less than 2 seconds.     Comments: Laceration of the left finger.  Laceration measures 2-3 cm.  Wound is not contaminated.  Neurological:     General: No focal deficit present.      Mental Status: She is alert and oriented to person, place, and time.      UC Treatments / Results  Labs (all labs ordered are listed, but only abnormal results are displayed) Labs Reviewed - No data to display  EKG   Radiology No results found.  Procedures Laceration Repair  Date/Time: 07/14/2019 10:43 AM Performed by: 07/16/2019, MD Authorized by: Merrilee Jansky, MD   Consent:    Consent obtained:  Verbal   Consent given by:  Patient   Risks discussed:  Poor cosmetic result and need for additional repair   Alternatives discussed:  No treatment Laceration details:    Location:  Finger   Finger location:  L long finger   Length (cm):  3   Depth (mm):  3 Repair type:    Repair type:  Simple Pre-procedure details:    Preparation:  Patient was prepped and draped in usual sterile fashion Exploration:    Wound exploration: wound explored through full range of motion     Contaminated: no   Treatment:    Area cleansed with:  Betadine and saline   Amount of cleaning:  Standard   Irrigation solution:  Sterile saline   Visualized foreign bodies/material removed: no   Skin repair:    Repair method:  Sutures   Suture size:  5-0   Suture material:  Prolene   Suture technique:  Simple interrupted Approximation:    Approximation:  Close Post-procedure details:    Dressing:  Antibiotic ointment and non-adherent dressing   Patient tolerance of procedure:  Tolerated well, no immediate complications   (including critical care time)  Medications Ordered in UC Medications  Tdap (BOOSTRIX) injection 0.5 mL (0.5 mLs Intramuscular Given 07/14/19 0857)    Initial Impression / Assessment and Plan / UC Course  I have reviewed the triage vital signs and the nursing notes.  Pertinent labs & imaging results that were available during my care of the patient were reviewed by me and considered in my medical decision making (see chart for details).   1. Laceration over  the left middle finger: Laceration repair done Tdap given Suture repair education given to  patient Take 5 to 7 days Work excuse given to patient. Patient notices increased pain, redness, discharge, fever or chills she is advised to return to the urgent care to be evaluated.  Final Clinical Impressions(s) / UC Diagnoses   Final diagnoses:  Laceration of left middle finger without foreign body without damage to nail, initial encounter   Discharge Instructions   None    ED Prescriptions    None     PDMP not reviewed this encounter.   Merrilee Jansky, MD 07/14/19 1049

## 2019-07-21 ENCOUNTER — Encounter: Payer: Self-pay | Admitting: Emergency Medicine

## 2019-07-21 ENCOUNTER — Ambulatory Visit
Admission: EM | Admit: 2019-07-21 | Discharge: 2019-07-21 | Disposition: A | Discharge status: Home / Self Care | Payer: Medicare HMO

## 2019-07-21 ENCOUNTER — Other Ambulatory Visit: Payer: Self-pay

## 2019-07-21 NOTE — ED Triage Notes (Addendum)
Pt present to [place] for suture removal. She was seen on 07/14/19 and had 2 sutures places. Wound healing well, 2 sutures. Pt tolerated procedure well.

## 2021-01-19 DEATH — deceased
